# Patient Record
Sex: Female | Born: 1980 | Race: White | Hispanic: No | Marital: Single
Health system: Southern US, Community
[De-identification: ages and names within clinical notes are randomized; demographics above are authoritative.]

---

## 2009-05-01 ENCOUNTER — Ambulatory Visit: Payer: Self-pay | Admitting: Family Medicine

## 2012-11-17 ENCOUNTER — Inpatient Hospital Stay: Payer: Self-pay | Admitting: Surgery

## 2012-11-17 LAB — COMPREHENSIVE METABOLIC PANEL
Albumin: 3.9 g/dL (ref 3.4–5.0)
Alkaline Phosphatase: 131 U/L (ref 50–136)
Anion Gap: 7 (ref 7–16)
BUN: 8 mg/dL (ref 7–18)
Bilirubin,Total: 0.5 mg/dL (ref 0.2–1.0)
Calcium, Total: 9.3 mg/dL (ref 8.5–10.1)
Chloride: 105 mmol/L (ref 98–107)
Co2: 24 mmol/L (ref 21–32)
Creatinine: 0.67 mg/dL (ref 0.60–1.30)
EGFR (African American): >60
EGFR (Non-African Amer.): >60
Glucose: 115 mg/dL — ABNORMAL HIGH (ref 65–99)
Osmolality: 271 (ref 275–301)
Potassium: 3.5 mmol/L (ref 3.5–5.1)
SGOT(AST): 14 U/L — ABNORMAL LOW (ref 15–37)
SGPT (ALT): 19 U/L (ref 12–78)
Sodium: 136 mmol/L (ref 136–145)
Total Protein: 7.4 g/dL (ref 6.4–8.2)

## 2012-11-17 LAB — URINALYSIS, COMPLETE
Bilirubin,UR: NEGATIVE
Glucose,UR: NEGATIVE mg/dL (ref 0–75)
Leukocyte Esterase: NEGATIVE
Nitrite: NEGATIVE
Protein: NEGATIVE
Specific Gravity: 1.027 (ref 1.003–1.030)
Squamous Epithelial: 1
WBC UR: 1 /HPF (ref 0–5)

## 2012-11-17 LAB — CBC
HGB: 14.4 g/dL (ref 12.0–16.0)
MCH: 30 pg (ref 26.0–34.0)
MCV: 87 fL (ref 80–100)
Platelet: 256 10*3/uL (ref 150–440)
RDW: 12 % (ref 11.5–14.5)
WBC: 18.3 10*3/uL — ABNORMAL HIGH (ref 3.6–11.0)

## 2012-11-18 LAB — BASIC METABOLIC PANEL
Calcium, Total: 8.8 mg/dL (ref 8.5–10.1)
Co2: 21 mmol/L (ref 21–32)
Creatinine: 0.75 mg/dL (ref 0.60–1.30)
EGFR (African American): >60
Glucose: 140 mg/dL — ABNORMAL HIGH (ref 65–99)
Osmolality: 271 (ref 275–301)
Potassium: 3.9 mmol/L (ref 3.5–5.1)

## 2012-11-18 LAB — CBC WITH DIFFERENTIAL/PLATELET
Basophil %: 0.1 %
Eosinophil %: 0 %
HGB: 13.9 g/dL (ref 12.0–16.0)
Lymphocyte %: 3.3 %
MCH: 30.4 pg (ref 26.0–34.0)
MCHC: 35.1 g/dL (ref 32.0–36.0)
Monocyte #: 1.4 x10 3/mm — ABNORMAL HIGH (ref 0.2–0.9)
Monocyte %: 6.8 %
Neutrophil #: 18.5 10*3/uL — ABNORMAL HIGH (ref 1.4–6.5)
Platelet: 240 10*3/uL (ref 150–440)
RBC: 4.56 10*6/uL (ref 3.80–5.20)
RDW: 12.5 % (ref 11.5–14.5)

## 2012-11-20 LAB — CBC WITH DIFFERENTIAL/PLATELET
Eosinophil %: 1.8 %
HCT: 35.9 % (ref 35.0–47.0)
Lymphocyte #: 1 10*3/uL (ref 1.0–3.6)
MCV: 87 fL (ref 80–100)
Monocyte #: 0.8 x10 3/mm (ref 0.2–0.9)
Monocyte %: 7.4 %
Neutrophil #: 8.9 10*3/uL — ABNORMAL HIGH (ref 1.4–6.5)
Neutrophil %: 81.1 %
RBC: 4.13 10*6/uL (ref 3.80–5.20)
RDW: 12.5 % (ref 11.5–14.5)
WBC: 11 10*3/uL (ref 3.6–11.0)

## 2012-11-20 LAB — BASIC METABOLIC PANEL
Anion Gap: 8 (ref 7–16)
BUN: 6 mg/dL — ABNORMAL LOW (ref 7–18)
Co2: 22 mmol/L (ref 21–32)
Creatinine: 0.76 mg/dL (ref 0.60–1.30)
Glucose: 95 mg/dL (ref 65–99)
Osmolality: 273 (ref 275–301)
Potassium: 3.4 mmol/L — ABNORMAL LOW (ref 3.5–5.1)
Sodium: 138 mmol/L (ref 136–145)

## 2012-11-27 ENCOUNTER — Inpatient Hospital Stay: Payer: Self-pay | Admitting: Surgery

## 2012-11-27 LAB — URINALYSIS, COMPLETE
Blood: NEGATIVE
Hyaline Cast: 3
Ketone: NEGATIVE
Leukocyte Esterase: NEGATIVE
Nitrite: NEGATIVE
Ph: 6 (ref 4.5–8.0)
RBC,UR: <1 /HPF (ref 0–5)
Squamous Epithelial: 1
WBC UR: 2 /HPF (ref 0–5)

## 2012-11-27 LAB — CBC WITH DIFFERENTIAL/PLATELET
Basophil #: 0 10*3/uL (ref 0.0–0.1)
Basophil %: 0.2 %
Eosinophil #: 0.1 10*3/uL (ref 0.0–0.7)
Eosinophil %: 0.5 %
Lymphocyte #: 1.3 10*3/uL (ref 1.0–3.6)
Lymphocyte %: 5.2 %
MCH: 29.2 pg (ref 26.0–34.0)
MCHC: 34.4 g/dL (ref 32.0–36.0)
Monocyte #: 1.6 x10 3/mm — ABNORMAL HIGH (ref 0.2–0.9)
Monocyte %: 6.5 %
Neutrophil #: 22.1 10*3/uL — ABNORMAL HIGH (ref 1.4–6.5)
WBC: 25.2 10*3/uL — ABNORMAL HIGH (ref 3.6–11.0)

## 2012-11-27 LAB — COMPREHENSIVE METABOLIC PANEL
Albumin: 3.2 g/dL — ABNORMAL LOW (ref 3.4–5.0)
Anion Gap: 9 (ref 7–16)
BUN: 11 mg/dL (ref 7–18)
Bilirubin,Total: 0.3 mg/dL (ref 0.2–1.0)
Chloride: 100 mmol/L (ref 98–107)
Creatinine: 0.88 mg/dL (ref 0.60–1.30)
EGFR (African American): >60
Osmolality: 262 (ref 275–301)
Potassium: 3.9 mmol/L (ref 3.5–5.1)
SGOT(AST): 41 U/L — ABNORMAL HIGH (ref 15–37)
Sodium: 131 mmol/L — ABNORMAL LOW (ref 136–145)
Total Protein: 8.5 g/dL — ABNORMAL HIGH (ref 6.4–8.2)

## 2012-11-28 LAB — CBC WITH DIFFERENTIAL/PLATELET
Basophil %: 0.3 %
Eosinophil %: 0.5 %
HCT: 35.8 % (ref 35.0–47.0)
HGB: 12.2 g/dL (ref 12.0–16.0)
Lymphocyte #: 1.4 10*3/uL (ref 1.0–3.6)
Lymphocyte %: 7.6 %
MCH: 29.2 pg (ref 26.0–34.0)
MCHC: 34 g/dL (ref 32.0–36.0)
MCV: 86 fL (ref 80–100)
Neutrophil %: 83.7 %
RBC: 4.18 10*6/uL (ref 3.80–5.20)
RDW: 12.6 % (ref 11.5–14.5)

## 2012-11-28 LAB — BASIC METABOLIC PANEL
Anion Gap: 12 (ref 7–16)
BUN: 5 mg/dL — ABNORMAL LOW (ref 7–18)
Calcium, Total: 8.9 mg/dL (ref 8.5–10.1)
Chloride: 104 mmol/L (ref 98–107)
EGFR (African American): >60
EGFR (Non-African Amer.): >60
Glucose: 88 mg/dL (ref 65–99)
Sodium: 136 mmol/L (ref 136–145)

## 2012-11-29 LAB — CBC WITH DIFFERENTIAL/PLATELET
Basophil #: 0 10*3/uL (ref 0.0–0.1)
Eosinophil #: 0.1 10*3/uL (ref 0.0–0.7)
HGB: 11.8 g/dL — ABNORMAL LOW (ref 12.0–16.0)
Lymphocyte %: 5.9 %
MCH: 29.5 pg (ref 26.0–34.0)
Monocyte #: 1.4 x10 3/mm — ABNORMAL HIGH (ref 0.2–0.9)
Monocyte %: 7.5 %
Neutrophil #: 16.6 10*3/uL — ABNORMAL HIGH (ref 1.4–6.5)
Neutrophil %: 85.8 %
Platelet: 341 10*3/uL (ref 150–440)
WBC: 19.4 10*3/uL — ABNORMAL HIGH (ref 3.6–11.0)

## 2012-11-29 LAB — PROTIME-INR: INR: 1.6

## 2012-11-29 LAB — APTT: Activated PTT: 40.2 secs — ABNORMAL HIGH (ref 23.6–35.9)

## 2012-11-30 LAB — CBC WITH DIFFERENTIAL/PLATELET
Basophil #: 0 10*3/uL (ref 0.0–0.1)
Basophil %: 0.3 %
Eosinophil #: 0 10*3/uL (ref 0.0–0.7)
Eosinophil %: 0.2 %
Lymphocyte #: 1.1 10*3/uL (ref 1.0–3.6)
MCH: 29.5 pg (ref 26.0–34.0)
MCHC: 34.5 g/dL (ref 32.0–36.0)
MCV: 86 fL (ref 80–100)
Monocyte %: 7.1 %
Platelet: 346 10*3/uL (ref 150–440)
RBC: 4 10*6/uL (ref 3.80–5.20)
RDW: 12.8 % (ref 11.5–14.5)

## 2012-11-30 LAB — BASIC METABOLIC PANEL
BUN: 3 mg/dL — ABNORMAL LOW (ref 7–18)
Chloride: 105 mmol/L (ref 98–107)
Creatinine: 0.72 mg/dL (ref 0.60–1.30)
EGFR (African American): >60
EGFR (Non-African Amer.): >60
Glucose: 116 mg/dL — ABNORMAL HIGH (ref 65–99)
Osmolality: 269 (ref 275–301)

## 2012-12-01 LAB — CBC WITH DIFFERENTIAL/PLATELET
Basophil %: 0.2 %
Eosinophil %: 2 %
HCT: 34 % — ABNORMAL LOW (ref 35.0–47.0)
HGB: 11.9 g/dL — ABNORMAL LOW (ref 12.0–16.0)
Neutrophil #: 7.8 10*3/uL — ABNORMAL HIGH (ref 1.4–6.5)
Neutrophil %: 74.9 %
WBC: 10.5 10*3/uL (ref 3.6–11.0)

## 2012-12-03 LAB — CBC WITH DIFFERENTIAL/PLATELET
Basophil #: 0 10*3/uL (ref 0.0–0.1)
Eosinophil #: 0.3 10*3/uL (ref 0.0–0.7)
Lymphocyte %: 25.6 %
MCH: 29.3 pg (ref 26.0–34.0)
Monocyte #: 0.8 x10 3/mm (ref 0.2–0.9)
Neutrophil #: 5 10*3/uL (ref 1.4–6.5)
Neutrophil %: 60.9 %
Platelet: 394 10*3/uL (ref 150–440)
RDW: 13.1 % (ref 11.5–14.5)

## 2012-12-03 LAB — CULTURE, BLOOD (SINGLE)

## 2012-12-05 LAB — BODY FLUID CULTURE

## 2014-06-29 NOTE — Consult Note (Signed)
PATIENT NAME:  Mary Mejia, Mary Mejia MR#:  [id number] DATE OF BIRTH:  October 14, 1980  DATE OF CONSULTATION:  11/27/2012  REFERRING PHYSICIAN:   CONSULTING PHYSICIAN:  Raymie Giammarco A. Lashawne Dura, MD  CHIEF COMPLAINT:  Fevers, history of ruptured appendix.   Ms. Mary Mejia is a pleasant 34 year old female who I received a phone call earlier regarding has generally been feeling more weaker and with malaise over the last few days since her discharge on the 15th.  She also reports that she had a fever of 101.5.  I thus asked her to come to the Emergency Room for evaluation.   HISTORY OF PRESENT ILLNESS:  Ms. Mary Mejia is a pleasant 34 year old female who was recently admitted from September 11th to 15th for right lower quadrant pain.  She had an appendectomy which was found to be ruptured.  She was admitted postoperatively for IV antibiotics.  At the time of discharge, she was taking good by mouth with good by mouth pain control.  She was voiding and stooling without difficulties.  She presents today with general not feeling quite herself, a bit more tired and a slowly increasing temperature.  Otherwise, no chills, night sweats, shortness of breath, cough, relatively little abdominal pain, no nausea, vomiting, diarrhea, constipation.   PAST MEDICAL HISTORY: 1.  Endometriosis.  2.  History of hysterectomy.  3.  Bipolar disorder.   CURRENT MEDICATIONS:  Are as follows:  1.  > 0.0375 24 hours twice weekly transdermal film extended release, one patch transdermal twice a week.  2.  Lithium carbonate two tabs twice daily.  3.  Hydroxyzine hydrochloride 1 to 2 tabs by mouth daily as needed.  4.  Seroquel 100 mg by mouth at bedtime. 5.  Risperdal 1 mg by mouth daily.  6.  Progesterone 100 mg by mouth at bedtime.  7.  Percocet 1 tab by mouth q. 4 hours as needed pain.   FAMILY HISTORY:  Noncontributory.   REVIEW OF SYSTEMS:  A 12  point review of systems obtained.  Pertinent positives and negatives as above.   PHYSICAL  EXAMINATION: VITAL SIGNS:  Temperature 99.1, pulse 125, blood pressure 116/77.  GENERAL:  No acute distress.  Alert and oriented x 3.  HEAD:  Normocephalic, atraumatic.  EYES:  No scleral icterus.  No conjunctivitis.  FACE:  No obvious facial trauma.  CHEST:  Lungs clear to auscultation.  Moving air well.  HEART:  Regular rate and rhythm.  No murmurs, rubs or gallops.  ABDOMEN:  Soft, nontender, nondistended.  Incisions clean, dry, intact.  EXTREMITIES:  Moves all extremities well.  Strength 5 out of 5.  NEUROLOGIC:  Cranial nerves II through XII grossly intact.   LABORATORY DATA:  White cell count of 25.2.  Otherwise labs are relatively unremarkable except sodium is 131.   IMAGING STUDIES:  A CT scan has been ordered, has not been performed yet.   ASSESSMENT AND PLAN:  Ms. Mary Mejia is a pleasant 34 year old female who presents with fevers postoperatively.  As she had ruptured appendicitis she is at high risk for abscess formation.  We will obtain a CT scan to determine need for antibiotics and possible percutaneous drainage.  We will evaluate after imaging.    ____________________________ Si Raiderhristopher A. Lexianna Weinrich, MD cal:ea D: 11/27/2012 04:26:57 ET Mejia: 11/27/2012 05:00:32 ET JOB#: [id number]  cc: Cristal Deerhristopher A. Waynetta Metheny, MD, <Dictator> Jarvis NewcomerHRISTOPHER A Waylan Busta MD ELECTRONICALLY SIGNED 12/05/2012 11:31

## 2014-06-29 NOTE — Discharge Summary (Signed)
PATIENT NAME:  Mary Mejia, Mary Mejia MR#:  [id number] DATE OF BIRTH:  01-11-1981  DATE OF ADMISSION:  11/27/2012 DATE OF DISCHARGE:  12/03/2012   DIAGNOSES: History of ruptured appendix, postappendectomy abscess, endometriosis, bipolar disorder.   PROCEDURES: CT-guided drainage of a right lateral lower quadrant abscess.   HISTORY OF PRESENT ILLNESS AND HOSPITAL COURSE: This is a patient with a history of recent ruptured appendix and laparoscopic appendectomy, who returned with fevers and increasing pain. A CT scan suggested an abscess. She was started on IV antibiotics and then taken back to CT scan for CT-guided drainage, which was performed. She improved rapidly and defervesced, being treated with IV antibiotics. She was switched to oral antibiotics, and then is being discharged with the drain in place because of fairly high outputs. After a follow-up CT scan showed near-complete resolution of the abscess cavity, she will be sent home on oral Cipro and Flagyl and Vicodin p.r.n. for pain. She is instructed to restart all of her medications and will follow up in our office this week to have the drain removed. I do not believe that an additional CT scan will be necessary prior to drain removal provided she remains afebrile at home.   ____________________________ Adah Salvageichard E. Excell Seltzerooper, MD rec:OSi D: 12/03/2012 08:43:36 ET Mejia: 12/03/2012 09:49:36 ET JOB#: [id number]  cc: Adah Salvageichard E. Excell Seltzerooper, MD, <Dictator> Lattie HawICHARD E Bela Bonaparte MD ELECTRONICALLY SIGNED 12/03/2012 16:41

## 2014-06-29 NOTE — Op Note (Signed)
PATIENT NAME:  Mary Mejia, Lizzy T MR#:  [id number] DATE OF BIRTH:  01-03-1981  DATE OF PROCEDURE:  11/17/2012  PREOPERATIVE DIAGNOSIS:  Acute appendicitis.   POSTOPERATIVE DIAGNOSIS:  Acute ruptured appendicitis.   SURGERY:  Laparoscopic appendectomy.   SURGEON:  Dr. Michela PitcherEly  ASSISTANT:  Andria MeuseStevens, [place] student and Ave Filterhandler, [place] student.   ANESTHESIA:  General.   OPERATIVE PROCEDURE: With the patient in the supine position after induction of appropriate general anesthesia, the patient's abdomen was prepped with ChloraPrep and draped with sterile towels. The patient was placed head down, feet up position. Her previous umbilical incision was reopened and carried down through the subcutaneous tissues with Bovie electrocautery. I cannot safely pass a Veress needle and was concerned about a possibility of adherent bowel to the anterior abdominal wall, so a Visiport apparatus was brought to the table. The abdomen was cannulated under direct vision. CO2 was insufflated without difficulty. There was a large inflammatory mass with an abscess present in the right lower quadrant. A midepigastric transverse incision was made and an 11 mm port inserted under direct vision. A suprapubic incision was made and a 12 mm port inserted under direct vision. The camera was loaded to the upper port _____ carried out through the two lower ports. The appendix appeared to be necrotic at the distal tip, quite enlarged and obviously ruptured. There was a collection of purulence in the right gutter. The base of the appendix was identified and divided with a single application.  Endo GIA stapler carrying a blue load, then a very difficult dissection ensued to divide the mesoappendix. Multiple applications of the Endo GIA stapler were required to divide all of the mesoappendix, as it was stuck down to the lateral gutter. The appendix was partially retrocecal. It did not appear to be any evidence of a bowel injury. There did not appear to be any  evidence of any remaining necrotic material. The appendix was captured and Endo Catch apparatus removed through the suprapubic incision. The area was irrigated with 3 liters of warm saline solution. A separate 5 mm port was placed in the right lower quadrant and a 19-French Blake drain inserted through the umbilical port and brought out through the right lower quadrant. The port was sutured in place with 3-0 nylon and placed into the pelvis along the right gutter. The suprapubic incision was closed using figure-of-eight suture and 0 Vicryl under direct vision. The skin was closed with 5-0 nylon. The area was infiltrated with 0.25% Marcaine for postoperative pain control. Sterile dressings were applied. The patient returned to the recovery room having tolerated the procedure well. Sponge, instrument and needle counts were correct x 2 in the operating room.   ____________________________ Quentin Orealph L. Ely III, MD rle:ce D: 11/17/2012 17:28:00 ET T: 11/17/2012 17:44:11 ET JOB#: [id number]  cc: Carmie Endalph L. Ely III, MD, <Dictator> Quentin OreALPH L ELY MD ELECTRONICALLY SIGNED 11/21/2012 19:25

## 2014-06-29 NOTE — Discharge Summary (Signed)
PATIENT NAME:  Mary Mejia, Mary Mejia MR#:  [id number] DATE OF BIRTH:  08-15-1980  DATE OF ADMISSION:  11/17/2012  DATE OF DISCHARGE:  11/21/2012  BRIEF HISTORY: [name] is a 34 year old woman admitted through the Emergency Room with signs and symptoms consistent with acute appendicitis. She had been sick for 24 to 36 hours at the time of her evaluation. She had a white blood cell count of 18,000 and CT evidence consistent with acute appendicitis. She was taken to surgery urgently that day, where she underwent a laparoscopic appendectomy. She was noted to have a ruptured appendicitis with significant evidence for inflammatory change in the abdomen. There did not appear to be a discrete abscess. The area was irrigated aggressively. She had slow return of bowel function on antibiotic therapy, but did improve over the next 48 hours. She was discharged home on the 15th, to be followed in the office in 7 to 10 days' time. Bathing, activity, and driving instructions were given to the patient. She was discharged home on Mini belle 0.0375 transdermal patch once a day, lithium carbonate 2 tablets 2 times a day, hydroxyzine hydrochloride 10 mg once a day, Seroquel 100 mg once a day, risperidone 1 mg once a day, progesterone 100 mg once a day, and Vicodin 5/325 every 4 to 6 hours p.r.n. pain.   FINAL DISCHARGE DIAGNOSIS:  Acute ruptured appendicitis.   SURGERY:  Laparoscopic appendectomy.     ____________________________ Carmie Endalph L. Ely III, MD rle:mr D: 12/07/2012 17:10:51 ET Mejia: 12/07/2012 20:25:50 ET JOB#: [id number]  cc: Quentin Orealph L. Ely III, MD, <Dictator> Quentin OreALPH L ELY MD ELECTRONICALLY SIGNED 12/09/2012 19:34

## 2015-09-19 IMAGING — CT CT ABD-PELV W/O CM
1 of 3 series · 14 of 32 positions shown, 18 images · non-contrast
Comparison: none

REASON FOR EXAM: (1) f/u abscess; (2) same
COMMENTS:

PROCEDURE:     CT  - CT ABDOMEN AND PELVIS W[DATE]  [DATE]
RESULT:
Comparison is made to a prior study dated 11/27/2012.
TECHNIQUE: Helical 3 mm sections were obtained from the lung bases through
the pubic symphysis status post administration of oral contrast.

[Series 2: 3mm soft tissue · axial · 0.77mm/px · z∈[-353,+91]mm · 14 of 168 slices shown, 18 images]
[im 13/168  soft-tissue]
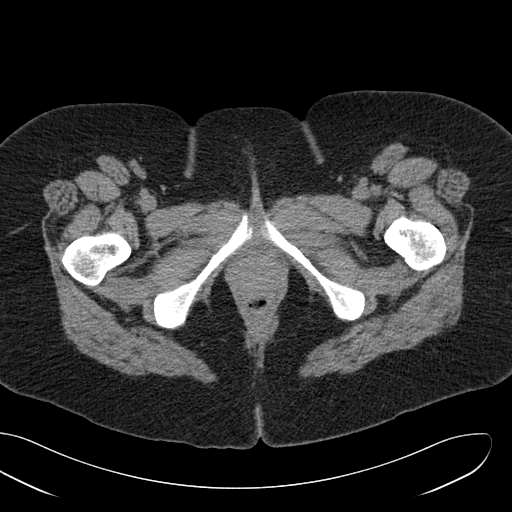
[im 13/168  bone]
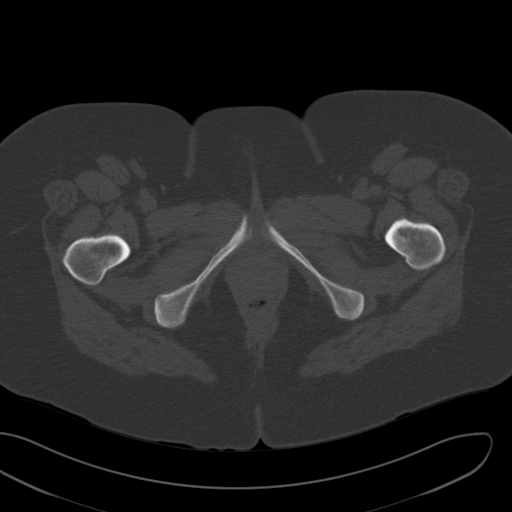
[im 26/168  soft-tissue]
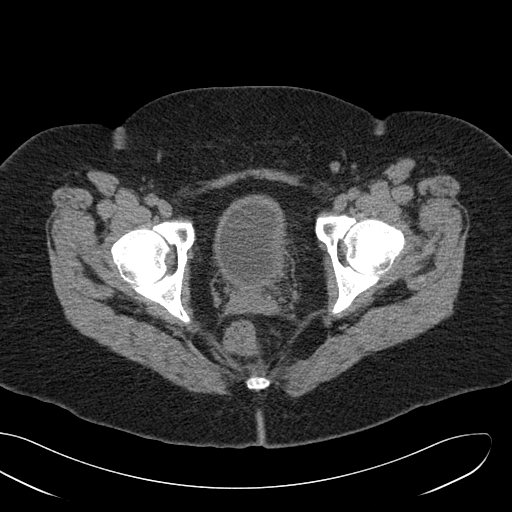
[im 39/168  soft-tissue]
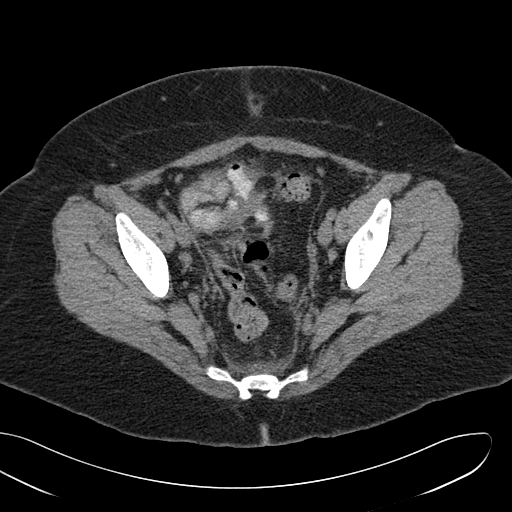
[im 52/168  soft-tissue]
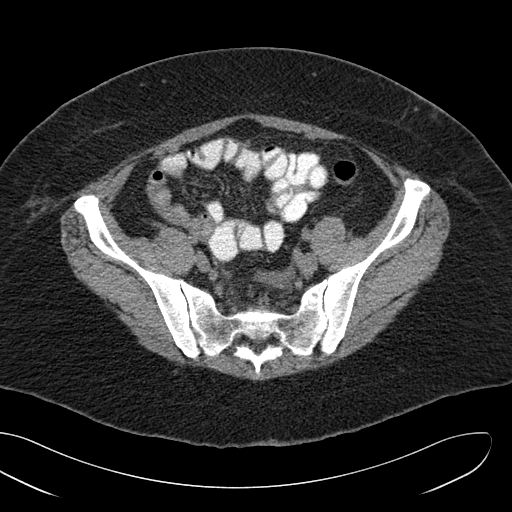
[im 65/168  soft-tissue]
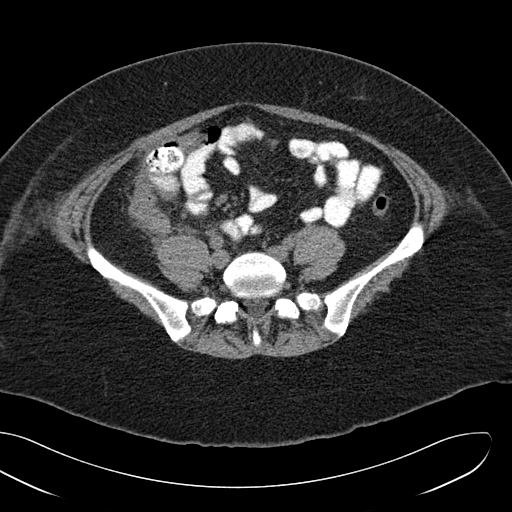
[im 78/168  soft-tissue]
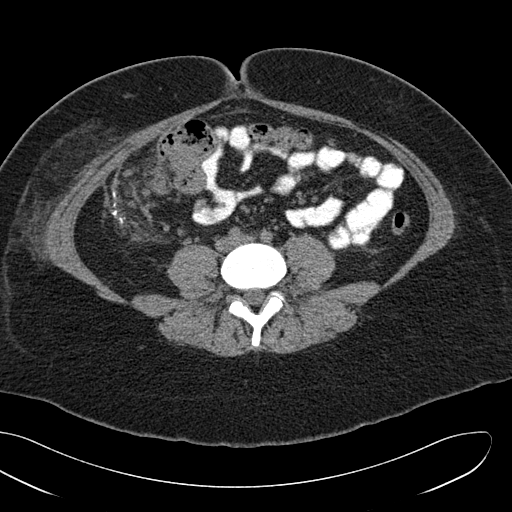
[im 90/168  soft-tissue]
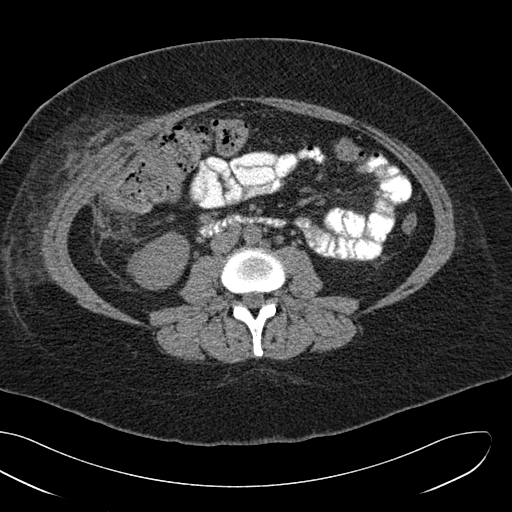
[im 103/168  soft-tissue]
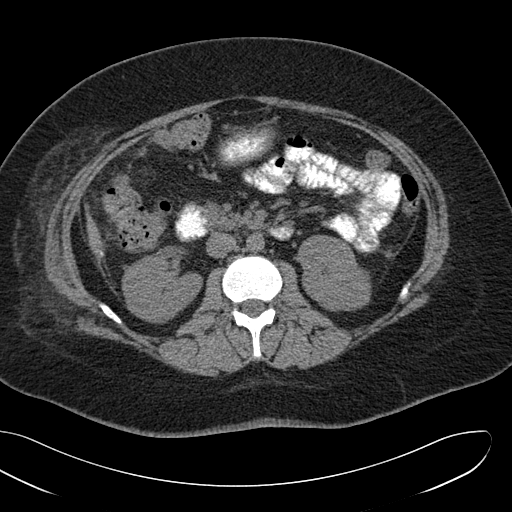
[im 116/168  soft-tissue]
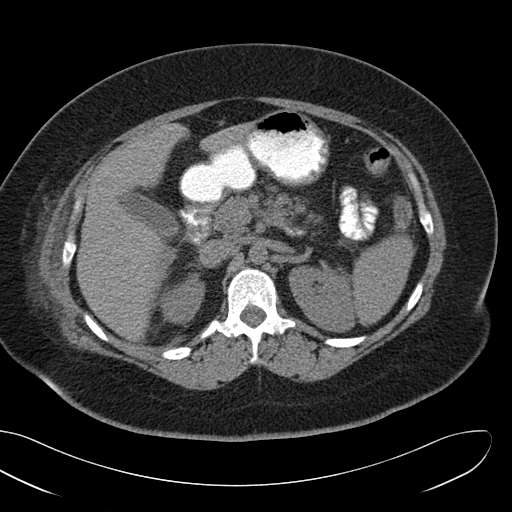
[im 116/168  bone]
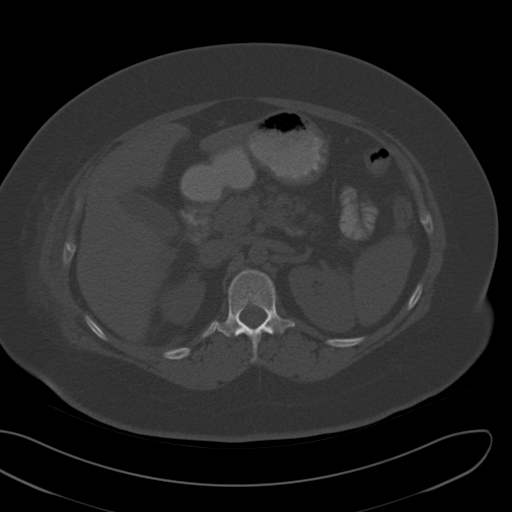
[im 129/168  soft-tissue]
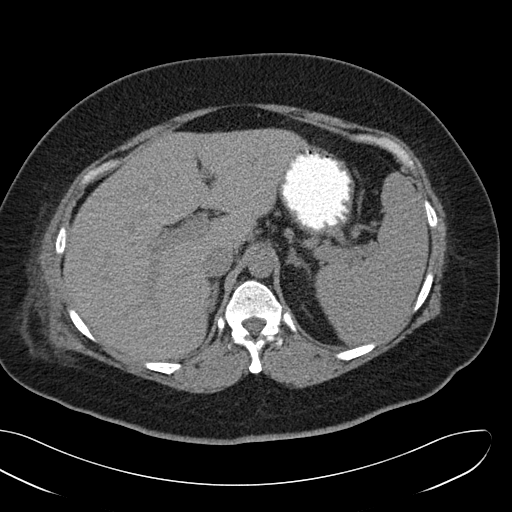
[im 142/168  soft-tissue]
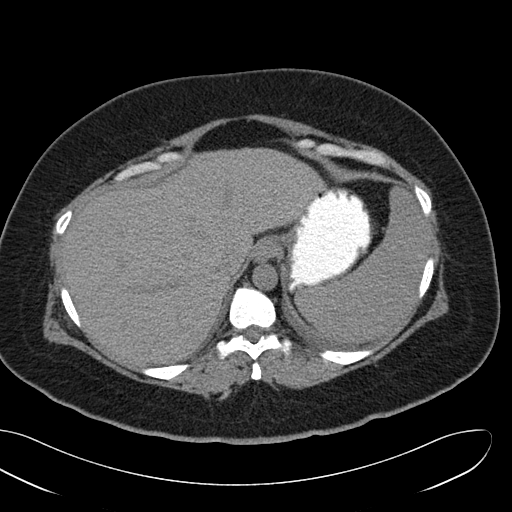
[im 142/168  lung]
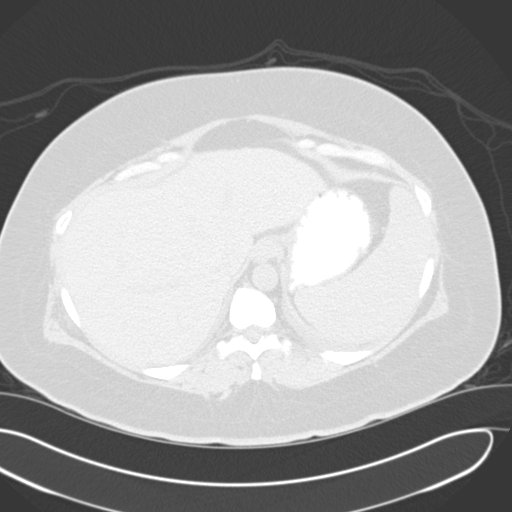
[im 148/168  lung]
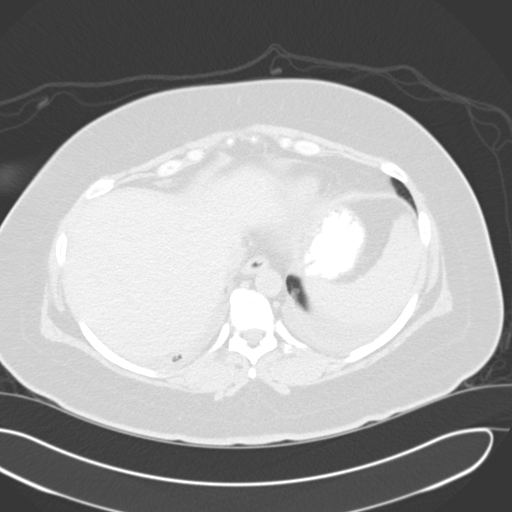
[im 155/168  soft-tissue]
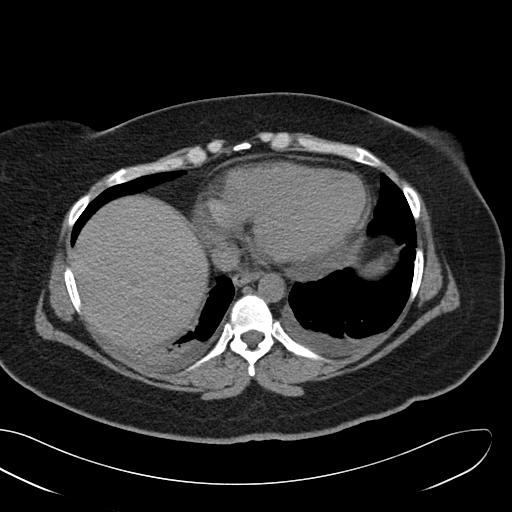
[im 155/168  lung]
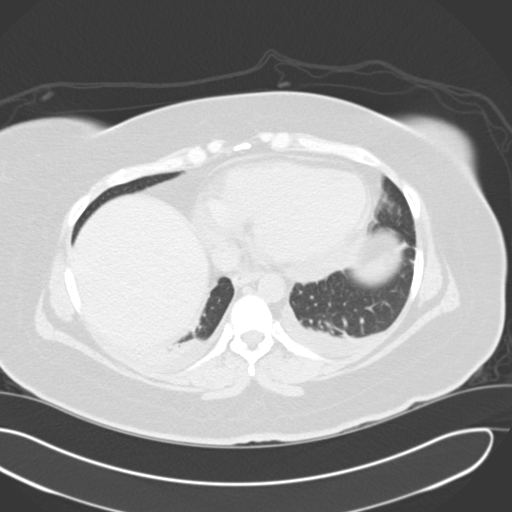
[im 161/168  lung]
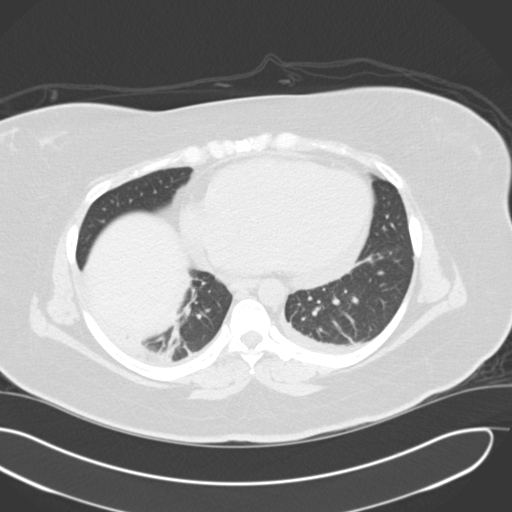

[14 of 32 positions shown; findings below may reference images not displayed]

FINDINGS: Evaluation of the lung bases demonstrates a mild area of
increased density within the dependent portions of the lung bases. Small,
bilateral effusions are identified as well as atelectasis versus mild
basilar infiltrates. There are also findings which appear to represent a
trace pericardial effusion.

Noncontrast evaluation of the liver, spleen, adrenals, pancreas, and kidneys
are unremarkable.

Within the right paracolic gutter region, a drainage catheter is
appreciated. There is near complete resolution of the loculated fluid
collection within this region. Small, trace amount of fluid is identified as
well as mild stranding within the surrounding mesenteric fat.

There is no CT evidence of bowel obstruction. Residual mild reactive colitis
within the bowel adjacent to the drainage site cannot be excluded.
Otherwise, no further evidence of enteritis or colitis. A trace amount of
free fluid is appreciated within the pelvis. The urinary bladder wall is
slightly prominent, a component of which is likely due to partial
decompression. Cystitis, if clinically appropriate, cannot be excluded.
IMPRESSION: 1.  Near complete resolution of the loculated fluid collection in the right
pericolic gutter region. There is minimal inflammatory stranding in the
mesenteric fat and a trace amount of fluid in this area. Correlation with
drainage out-put is recommended.
2.  Findings which represent a component of reactive colitis within the
proximal ascending colon/cecal region in the vicinity of the previously
described drained fluid collection.
3.  Finding which may represent a component of mild bladder wall thickening
though likely representing incomplete distention.
4.  Small amount of fluid within the pelvis. The previously described
loculated fluid collection within the region of the cul-de-sac appears to
have resolved in the interim.
5.  Trace pericardial effusion and clinical correlation recommended.
